# Patient Record
Sex: Male | Born: 1980 | Hispanic: No | Marital: Married | Smoking: Current every day smoker
Health system: Southern US, Community
[De-identification: ages and names within clinical notes are randomized; demographics above are authoritative.]

## PROBLEM LIST (undated history)

## (undated) DIAGNOSIS — E7800 Pure hypercholesterolemia, unspecified: Secondary | ICD-10-CM

## (undated) DIAGNOSIS — I10 Essential (primary) hypertension: Secondary | ICD-10-CM

---

## 2013-02-24 ENCOUNTER — Emergency Department (HOSPITAL_COMMUNITY)
Admission: EM | Admit: 2013-02-24 | Discharge: 2013-02-24 | Disposition: A | Discharge status: Home / Self Care | Payer: Self-pay | Attending: Emergency Medicine | Admitting: Emergency Medicine

## 2013-02-24 ENCOUNTER — Encounter (HOSPITAL_COMMUNITY): Payer: Self-pay | Admitting: Emergency Medicine

## 2013-02-24 ENCOUNTER — Emergency Department (HOSPITAL_COMMUNITY): Payer: Self-pay

## 2013-02-24 DIAGNOSIS — R002 Palpitations: Secondary | ICD-10-CM | POA: Insufficient documentation

## 2013-02-24 DIAGNOSIS — R42 Dizziness and giddiness: Secondary | ICD-10-CM | POA: Insufficient documentation

## 2013-02-24 DIAGNOSIS — I10 Essential (primary) hypertension: Secondary | ICD-10-CM | POA: Insufficient documentation

## 2013-02-24 DIAGNOSIS — Z8639 Personal history of other endocrine, nutritional and metabolic disease: Secondary | ICD-10-CM | POA: Insufficient documentation

## 2013-02-24 DIAGNOSIS — F17200 Nicotine dependence, unspecified, uncomplicated: Secondary | ICD-10-CM | POA: Insufficient documentation

## 2013-02-24 DIAGNOSIS — Z79899 Other long term (current) drug therapy: Secondary | ICD-10-CM | POA: Insufficient documentation

## 2013-02-24 DIAGNOSIS — Z862 Personal history of diseases of the blood and blood-forming organs and certain disorders involving the immune mechanism: Secondary | ICD-10-CM | POA: Insufficient documentation

## 2013-02-24 HISTORY — DX: Essential (primary) hypertension: I10

## 2013-02-24 HISTORY — DX: Pure hypercholesterolemia, unspecified: E78.00

## 2013-02-24 LAB — RAPID URINE DRUG SCREEN, HOSP PERFORMED
AMPHETAMINES: NOT DETECTED
BENZODIAZEPINES: NOT DETECTED
Barbiturates: NOT DETECTED
Cocaine: NOT DETECTED
Opiates: NOT DETECTED
TETRAHYDROCANNABINOL: NOT DETECTED

## 2013-02-24 LAB — BASIC METABOLIC PANEL
BUN: 11 mg/dL (ref 6–23)
CO2: 26 mEq/L (ref 19–32)
CREATININE: 0.86 mg/dL (ref 0.50–1.35)
Calcium: 9.6 mg/dL (ref 8.4–10.5)
Chloride: 99 mEq/L (ref 96–112)
GFR calc Af Amer: >90 mL/min (ref >90)
GLUCOSE: 124 mg/dL — AB (ref 70–99)
POTASSIUM: 3.8 meq/L (ref 3.7–5.3)
Sodium: 138 mEq/L (ref 137–147)

## 2013-02-24 LAB — CBC
HEMATOCRIT: 44 % (ref 39.0–52.0)
HEMOGLOBIN: 16.2 g/dL (ref 13.0–17.0)
MCH: 30.5 pg (ref 26.0–34.0)
MCHC: 36.8 g/dL — AB (ref 30.0–36.0)
MCV: 82.7 fL (ref 78.0–100.0)
Platelets: 271 10*3/uL (ref 150–400)
RBC: 5.32 MIL/uL (ref 4.22–5.81)
RDW: 12.2 % (ref 11.5–15.5)
WBC: 7.5 10*3/uL (ref 4.0–10.5)

## 2013-02-24 LAB — URINALYSIS, ROUTINE W REFLEX MICROSCOPIC
Bilirubin Urine: NEGATIVE
GLUCOSE, UA: NEGATIVE mg/dL
HGB URINE DIPSTICK: NEGATIVE
KETONES UR: NEGATIVE mg/dL
LEUKOCYTES UA: NEGATIVE
Nitrite: NEGATIVE
PH: 7.5 (ref 5.0–8.0)
Protein, ur: NEGATIVE mg/dL
Specific Gravity, Urine: 1.011 (ref 1.005–1.030)
Urobilinogen, UA: 0.2 mg/dL (ref 0.0–1.0)

## 2013-02-24 LAB — POCT I-STAT TROPONIN I: Troponin i, poc: 0.08 ng/mL (ref 0.00–0.08)

## 2013-02-24 MED ORDER — SODIUM CHLORIDE 0.9 % IV BOLUS (SEPSIS)
1000.0000 mL | Freq: Once | INTRAVENOUS | Status: AC
  Administered 2013-02-24: 1000 mL via INTRAVENOUS

## 2013-02-24 NOTE — ED Notes (Signed)
Pt given urinal.

## 2013-02-24 NOTE — ED Notes (Signed)
Pt reports he is feeling better.

## 2013-02-24 NOTE — ED Notes (Signed)
Pt c/o palpitations with lightheadedness starting 10 min ago

## 2013-02-24 NOTE — Discharge Instructions (Signed)
Please follow up with your primary care physician in 1-2 days. If you do not have one please call one from list below. Please follow up with the Cardiology office [facility]) to schedule a follow up appointment. Please read all discharge instructions and return precautions.  Palpitations  A palpitation is the feeling that your heartbeat is irregular. It may feel like your heart is fluttering or skipping a beat. It may also feel like your heart is beating faster than normal. This is usually not a serious problem. In some cases, you may need more medical tests. HOME CARE  Avoid:  Caffeine in coffee, tea, soft drinks, diet pills, and energy drinks.  Chocolate.  Alcohol.  Stop smoking if you smoke.  Reduce your stress and anxiety. Try:  A method that measures bodily functions so you can learn to control them (biofeedback).  Yoga.  Meditation.  Physical activity such as swimming, jogging, or walking.  Get plenty of rest and sleep. GET HELP RIGHT AWAY IF:   You have chest pain.  You feel short of breath.  You have a very bad headache.  You feel dizzy or pass out (faint).  Your fast or irregular heartbeat continues after 24 hours.  Your palpitations occur more often. MAKE SURE YOU:   Understand these instructions.  Will watch your condition.  Will get help right away if you are not doing well or get worse. Document Released: 11/15/2007 Document Revised: 08/07/2011 Document Reviewed: 04/06/2011 [facility] Patient Information 2014 [employer], [place].

## 2013-02-24 NOTE — ED Provider Notes (Signed)
CSN: [id number]     Arrival date & time 02/24/13  1401 History   First MD Initiated Contact with Patient 02/24/13 1724     Chief Complaint  Patient presents with  . Palpitations   (Consider location/radiation/quality/duration/timing/severity/associated sxs/prior Treatment) HPI Comments: Patient is a 33 year old male medical history significant for hypertension, hypercholesteremia presenting to the emergency department for one episode of palpations with associated light headedness approximately three hours ago. Patient states he felt "sick to his stomach" and then developed palpitations and lightheadedness without loss of consciousness. Patient states the episode lasted approximately 10-15 minutes and he has not had any symptoms since then. Patient states he had an episode this 5-6 months ago [facility] due to drug use. 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 He states over the last 2-3 months he has had intermittent episodes of palpations lasting 10-20 minutes relieved by rest. Patient denies any recreational drug use since being discharged. He endorses very intermittent alcohol use, stating he had 2 beers yesterday and the day before but none prior to that until New Year's Eve. Patient states at the time of discharge he was started on cholesterol and blood pressure medication which she has been taking daily aspirin prescribed without any recent changes. He states he felt the medicines were making him better until today. Denies any recent illnesses. He has no early familial cardiac history. He has no other personal cardiac history besides hypertension and hypercholesterolemia.  Patient is a 33 y.o. male presenting with palpitations.  Palpitations   Past Medical History  Diagnosis Date  . Hypertension   . Hypercholesteremia    History reviewed. No pertinent past surgical history. History reviewed. No  pertinent family history. History  Substance Use Topics  . Smoking status: Current Every Day Smoker  . Smokeless tobacco: Not on file  . Alcohol Use: Yes     Comment: occ    Review of Systems  Cardiovascular: Positive for palpitations.  Neurological: Positive for light-headedness.  All other systems reviewed and are negative.    Allergies  Review of patient's allergies indicates no known allergies.  Home Medications   Current Outpatient Rx  Name  Route  Sig  Dispense  Refill  . lisinopril (PRINIVIL,ZESTRIL) 10 MG tablet   Oral   Take 10 mg by mouth daily.         Marland Kitchen. omega-3 acid ethyl esters (LOVAZA) 1 G capsule   Oral   Take 1 g by mouth 2 (two) times daily.          BP 123/71  Pulse 75  Temp(Src) 97.6 F (36.4 C) (Oral)  Resp 15  SpO2 99% Physical Exam  Constitutional: He is oriented to person, place, and time. He appears well-developed and well-nourished. No distress.  HENT:  Head: Normocephalic and atraumatic.  Right Ear: External ear normal.  Left Ear: External ear normal.  Nose: Nose normal.  Mouth/Throat: Oropharynx is clear and moist. No oropharyngeal exudate.  Eyes: Conjunctivae and EOM are normal. Pupils are equal, round, and reactive to light.  Neck: Normal range of motion. Neck supple.  Cardiovascular: Normal rate, regular rhythm, normal heart sounds and intact distal pulses.   Pulmonary/Chest: Effort normal and breath sounds normal. No respiratory distress. He exhibits no tenderness.  Abdominal: Soft. Bowel sounds are normal. There is no tenderness.  Musculoskeletal: Normal range of motion. He exhibits no edema and no tenderness.  Lymphadenopathy:  He has no cervical adenopathy.  Neurological: He is alert and oriented to person, place, and time. He has normal strength. No cranial nerve deficit or sensory deficit. Gait normal. GCS eye subscore is 4. GCS verbal subscore is 5. GCS motor subscore is 6.  No pronator drift. Bilateral heel-knee-shin  intact.  Skin: Skin is warm and dry. He is not diaphoretic.    ED Course  Procedures (including critical care time) Medications  sodium chloride 0.9 % bolus 1,000 mL (0 mLs Intravenous Stopped 02/24/13 1858)    Labs Review Labs Reviewed  CBC - Abnormal; Notable for the following:    MCHC 36.8 (*)    All other components within normal limits  BASIC METABOLIC PANEL - Abnormal; Notable for the following:    Glucose, Bld 124 (*)    All other components within normal limits  URINALYSIS, ROUTINE W REFLEX MICROSCOPIC - Abnormal; Notable for the following:    APPearance CLOUDY (*)    All other components within normal limits  URINE RAPID DRUG SCREEN (HOSP PERFORMED)  POCT I-STAT TROPONIN I   Imaging Review Dg Chest 2 View  02/24/2013   CLINICAL DATA:  Palpitations, hypertension and dizziness.  EXAM: CHEST - 2 VIEW  COMPARISON:  None  FINDINGS: The heart size and mediastinal contours are within normal limits. There is no evidence of pulmonary edema, consolidation, pneumothorax, nodule or pleural fluid. The visualized skeletal structures are unremarkable.  IMPRESSION: No active disease.   Electronically Signed   By: Irish Lack M.D.   On: 02/24/2013 14:48    EKG Interpretation   None       MDM   1. Palpitations     5:48 PM Heart Score is 1  Afebrile, NAD, non-toxic appearing, AAOx4. Patient with history of intermittent episodes of palpitations with near syncope. No LOC. Patient symptom free in ED, stating his lightheadedness improved after a 1L Bolus of NS. No neurofocal deficits on examination. CV exam: RRR w/o M, R, G. Lungs CTA. Labs reviewed w/o abnormality. CXR negative. EKG shows NSR w/o acute abnormality. Will refer patient to cardiology as an outpatient for possible holter monitoring and/or further evaluation. Discussed the need to avoid caffeine, alcohol, sodas and drink plenty of water to stay hydrated. Return precautions discussed. Patient is agreeable to plan. Patient is  stable at time of discharge. Patient d/w with Dr. Romeo Apple, agrees with plan.         Jeannetta Ellis, PA-C 02/24/13 2058

## 2013-02-24 NOTE — ED Notes (Addendum)
Pt reports about 3 hrs ago he felt like his heart was racing, he was eating right before it happened. Reports feeling a little lightheaded with it.reports that for the past 3 months he has had multiple episodes of this, lasting about 20-30 mins, has to sit down and relax then they seem to go away. Now sts that the feeling is gone but he was concerned about his heart and what is causing this. Nad, skin warm and dry, resp e/u .

## 2013-02-25 NOTE — ED Provider Notes (Signed)
Medical screening examination/treatment/procedure(s) were performed by non-physician practitioner and as supervising physician I was immediately available for consultation/collaboration.  EKG Interpretation   None         Raylin Winer S Macgregor Aeschliman, MD 02/25/13 1130 

## 2015-02-02 IMAGING — CR DG CHEST 2V
2 series · 2 of 2 positions shown · non-contrast
Comparison: None

CLINICAL DATA: Palpitations, hypertension and dizziness.

EXAM:
CHEST - 2 VIEW

[w chest pa]
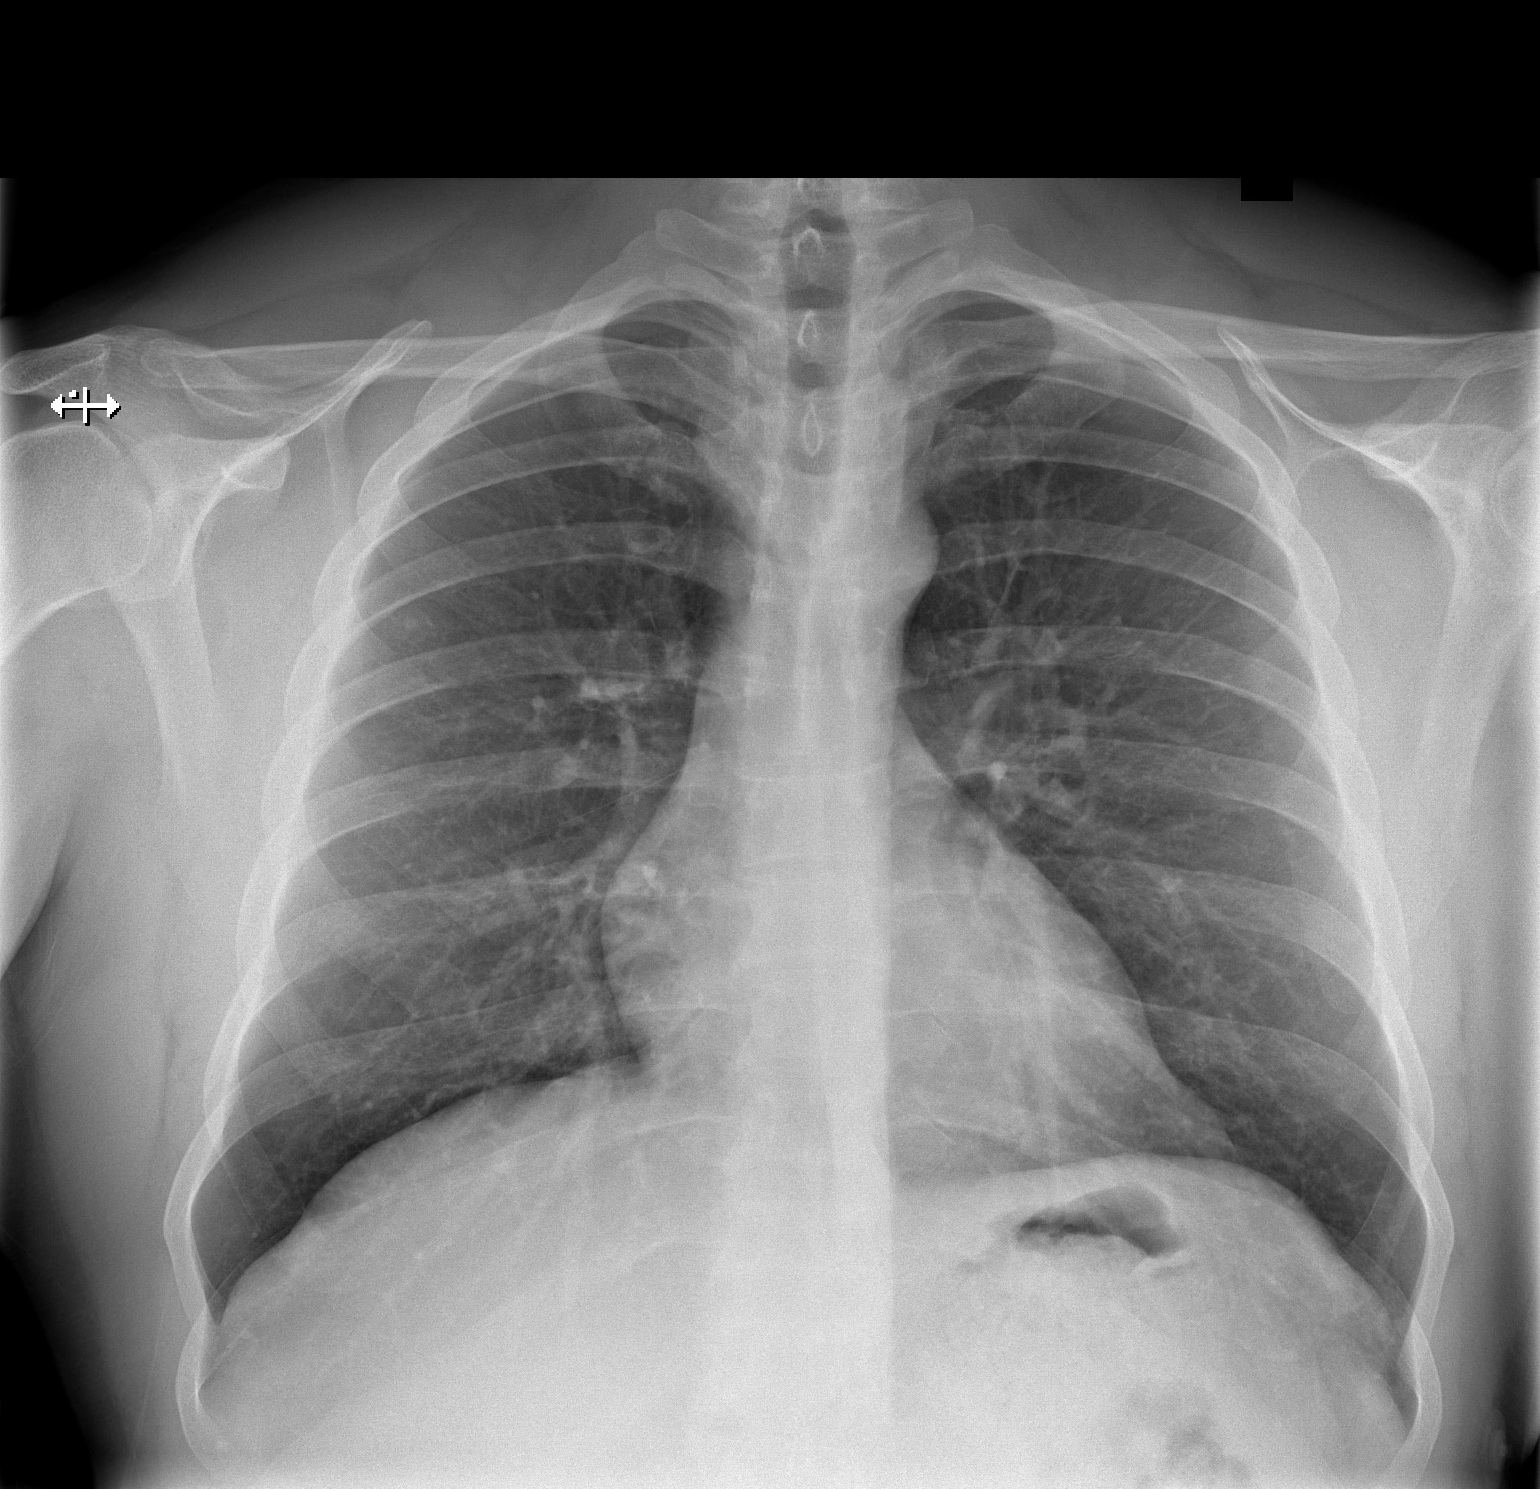

[w chest lat]
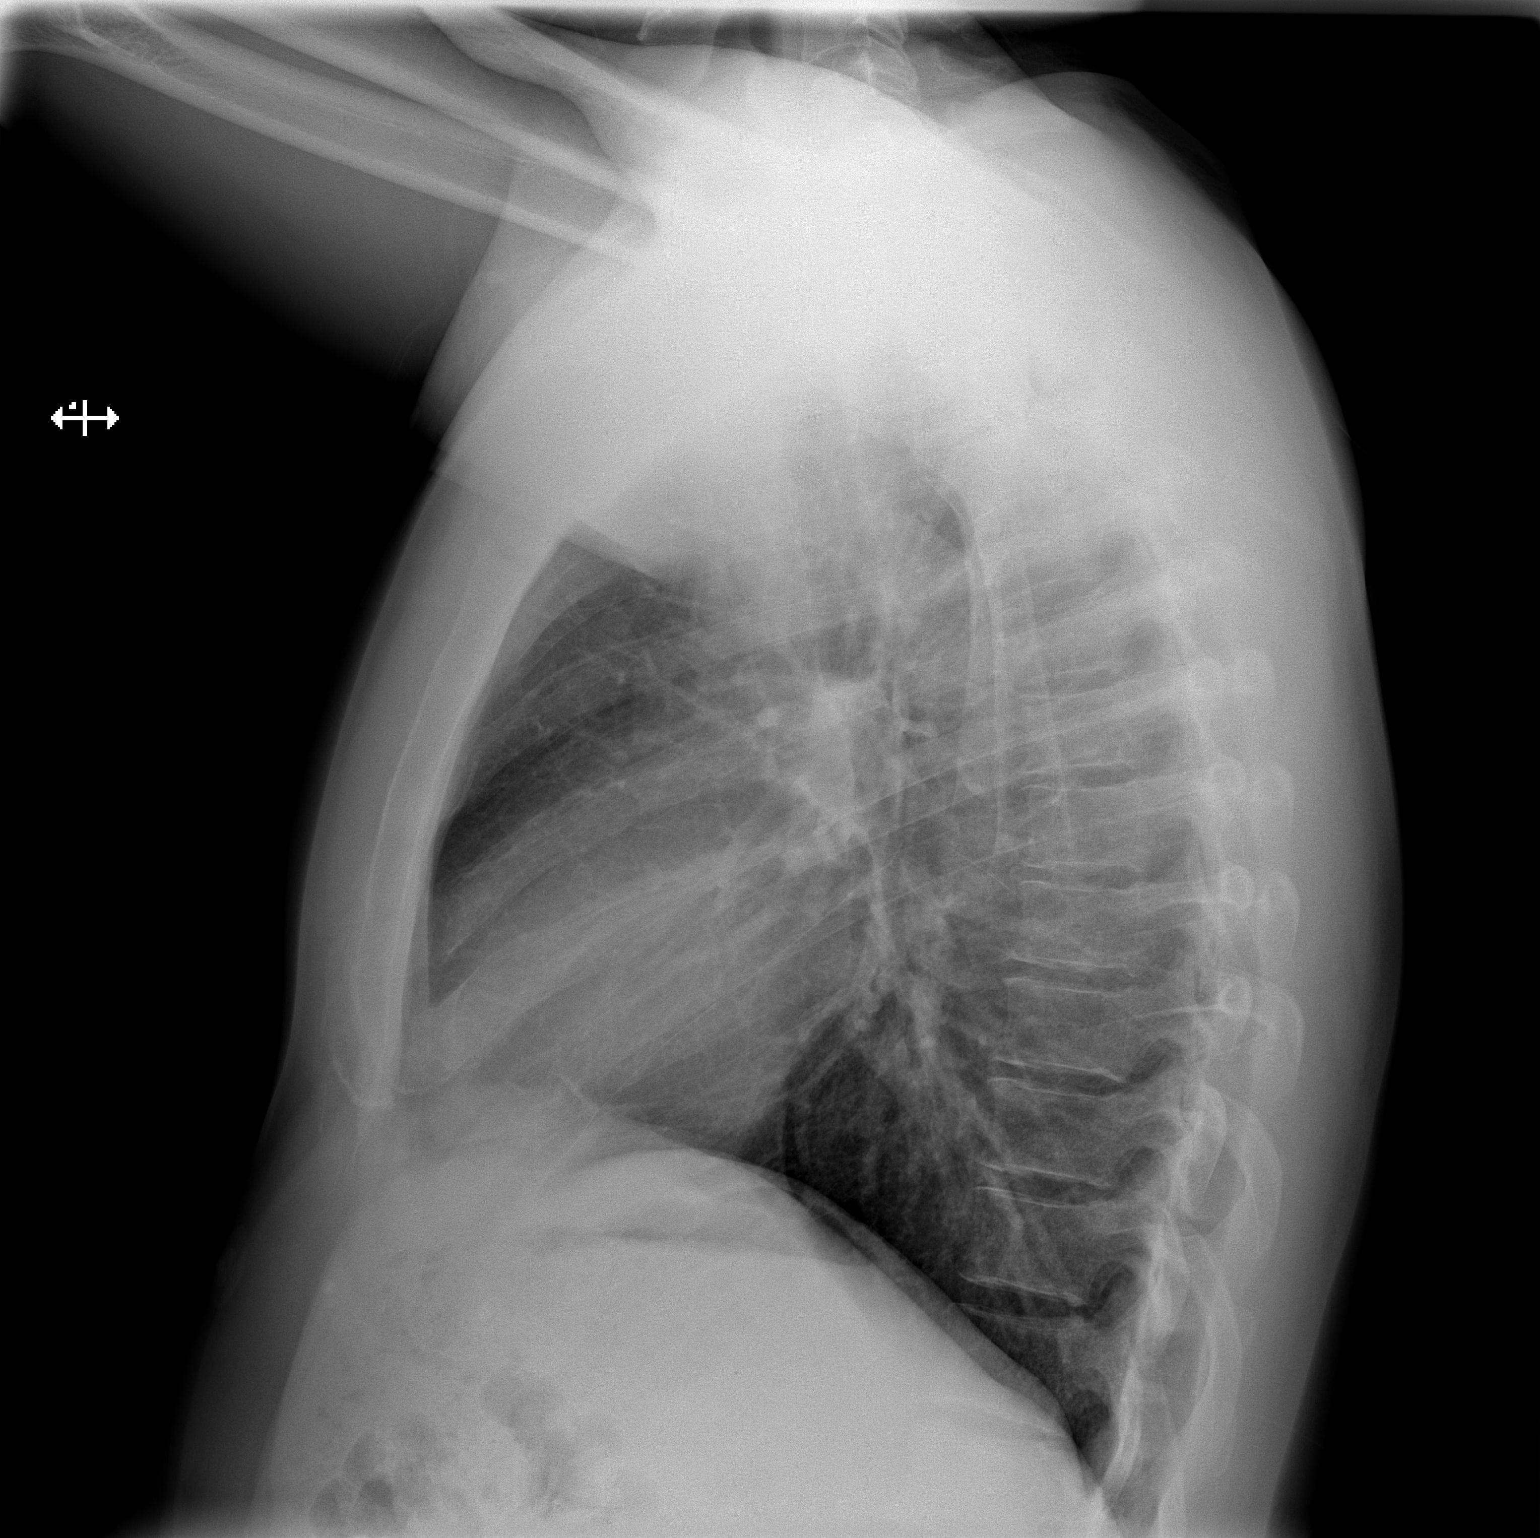

[2 of 2 positions shown; findings below may reference images not displayed]

FINDINGS: The heart size and mediastinal contours are within normal limits.
There is no evidence of pulmonary edema, consolidation,
pneumothorax, nodule or pleural fluid. The visualized skeletal
structures are unremarkable.
IMPRESSION: No active disease.
